# Patient Record
Sex: Female | Born: 2000 | Race: White | Hispanic: No | Marital: Single | Smoking: Never smoker
Health system: Southern US, Community
[De-identification: ages and names within clinical notes are randomized; demographics above are authoritative.]

## PROBLEM LIST (undated history)

## (undated) DIAGNOSIS — Z789 Other specified health status: Secondary | ICD-10-CM

## (undated) HISTORY — DX: Other specified health status: Z78.9

## (undated) HISTORY — PX: OTHER SURGICAL HISTORY: SHX169

---
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
## 2020-02-07 ENCOUNTER — Encounter: Payer: Self-pay | Admitting: Dermatology

## 2020-02-07 ENCOUNTER — Other Ambulatory Visit: Payer: Self-pay

## 2020-02-07 ENCOUNTER — Ambulatory Visit: Payer: 59 | Admitting: Dermatology

## 2020-02-07 DIAGNOSIS — L710 Perioral dermatitis: Secondary | ICD-10-CM | POA: Diagnosis not present

## 2020-02-07 DIAGNOSIS — L700 Acne vulgaris: Secondary | ICD-10-CM

## 2020-02-07 DIAGNOSIS — L853 Xerosis cutis: Secondary | ICD-10-CM

## 2020-02-07 MED ORDER — DOXYCYCLINE HYCLATE 20 MG PO TABS: 20.0000 mg | ORAL_TABLET | Freq: Two times a day (BID) | ORAL | 2 refills | Status: AC

## 2020-02-07 MED ORDER — TRETINOIN 0.025 % EX CREA: TOPICAL_CREAM | CUTANEOUS | 11 refills | Status: DC

## 2020-02-07 NOTE — Progress Notes (Signed)
Follow-Up Visit   Subjective  Betty Mullins is a 20 y.o. female who presents for the following: Follow-up (Patient is here today for follow up on acne. She states she has not been in some time for follow up and is no longer using any of acne medication we prescribed for her. She is also having concerns with very dry skin all over and on eyelids. ).  Patient has been off medications for 6 months.  She has had acne for years.  The following portions of the chart were reviewed this encounter and updated as appropriate:  Tobacco  Allergies  Meds  Problems  Med Hx  Surg Hx  Fam Hx      Objective  Well appearing patient in no apparent distress; mood and affect are within normal limits.  A focused examination was performed including face. Relevant physical exam findings are noted in the Assessment and Plan.  Objective  face: Scattered inflammatory papules at the face, trace to 1+ open comedones  Objective  perioral area: Many inflammatory papules in perioral distribution  Assessment & Plan  Acne vulgaris face  Chronic condition with duration over one year. Condition is bothersome to patient. Currently flared.  BP today 116/77 Plan to start spironolactone 50 mg tablets by mouth twice daily.  She tolerated this well in the past without any difficulty.  She will fax over her lab results from [place] before starting.  Spironolactone can cause increased urination and cause blood pressure to decrease. Please watch for signs of lightheadedness and be cautious when changing position. It can sometimes cause breast tenderness or an irregular period in premenopausal women. It can also increase potassium. The increase in potassium usually is not a concern unless you are taking other medicines that also increase potassium, so please be sure your doctor knows all of the other medications you are taking. This medication should not be taken by pregnant women.  This medicine should also not be  taken together with sulfa drugs like Bactrim (trimethoprim/sulfamethexazole).   Start tretinoin 0.025% cream nightly Topical retinoid medications like tretinoin/Retin-A, adapalene/Differin, tazarotene/Fabior, and Epiduo/Epiduo Forte can cause dryness and irritation when first started. Only apply a pea-sized amount to the entire affected area. Avoid applying it around the eyes, edges of mouth and creases at the nose. If you experience irritation, use a good moisturizer first and/or apply the medicine less often. If you are doing well with the medicine, you can increase how often you use it until you are applying every night. Be careful with sun protection while using this medication as it can make you sensitive to the sun. This medicine should not be used by pregnant women.    Perioral dermatitis perioral area  Start Doxycycline 20 mg take by mouth twice daily with food   Doxycycline should be taken with food to prevent nausea. Do not lay down for 30 minutes after taking. Be cautious with sun exposure and use good sun protection while on this medication. Pregnant women should not take this medication.   Advised it will likely take 2 to 3 months for complete clearance  Ordered Medications: doxycycline (PERIOSTAT) 20 MG tablet  Xerosis - Scaly xerotic patches at face - Recommend vanicream moisturizing cream. - Gentle skin care info provided.  Return in about 2 months (around 04/06/2020) for follow up on perioral dermatitis and acne .  I, Asher Muir, CMA, am acting as scribe for Darden Dates, MD.  Documentation: I have reviewed the above documentation for accuracy  and completeness, and I agree with the above.  Forest Gleason, MD

## 2020-02-07 NOTE — Patient Instructions (Addendum)
Doxycycline should be taken with food to prevent nausea. Do not lay down for 30 minutes after taking. Be cautious with sun exposure and use good sun protection while on this medication. Pregnant women should not take this medication.  Spironolactone can cause increased urination and cause blood pressure to decrease. Please watch for signs of lightheadedness and be cautious when changing position. It can sometimes cause breast tenderness or an irregular period in premenopausal women. It can also increase potassium. The increase in potassium usually is not a concern unless you are taking other medicines that also increase potassium, so please be sure your doctor knows all of the other medications you are taking. This medication should not be taken by pregnant women.  This medicine should also not be taken together with sulfa drugs like Bactrim (trimethoprim/sulfamethexazole).   Topical retinoid medications like tretinoin/Retin-A, adapalene/Differin, tazarotene/Fabior, and Epiduo/Epiduo Forte can cause dryness and irritation when first started. Only apply a pea-sized amount to the entire affected area. Avoid applying it around the eyes, edges of mouth and creases at the nose. If you experience irritation, use a good moisturizer first and/or apply the medicine less often. If you are doing well with the medicine, you can increase how often you use it until you are applying every night. Be careful with sun protection while using this medication as it can make you sensitive to the sun. This medicine should not be used by pregnant women.    Dry Skin Care  What causes dry skin?  Dry skin is common and results from inadequate moisture in the outer skin layers. Dry skin usually results from the excessive loss of moisture from the skin surface. This occurs due to two major factors: 1. Normally the skin's oil glands deposit a layer of oil on the skin's surface. This layer of oil prevents the loss of moisture from the  skin. Exposure to soaps, cleaners, solvents, and disinfectants removes this oily film, allowing water to escape. 2. Water loss from the skin increases when the humidity is low. During winter months we spend a lot of time indoors where the air is heated. Heated air has very low humidity. This also contributes to dry skin.  A tendency for dry skin may accompany such disorders as eczema. Also, as people age, the number of functioning oil glands decreases, and the tendency toward dry skin can be a sensation of skin tightness when emerging from the shower.  How do I manage dry skin?  1. Humidify your environment. This can be accomplished by using a humidifier in your bedroom at night during winter months. 2. Bathing can actually put moisture back into your skin if done right. Take the following steps while bathing to sooth dry skin:  Avoid hot water, which only dries the skin and makes itching worse. Use warm water.  Avoid washcloths or extensive rubbing or scrubbing.  Use mild soaps like unscented Dove, Oil of Olay, Cetaphil, Basis, or CeraVe.  If you take baths rather than showers, rinse off soap residue with clean water before getting out of tub.  Once out of the shower/tub, pat dry gently with a soft towel. Leave your skin damp.  While still damp, apply any medicated ointment/cream you were prescribed to the affected areas. After you apply your medicated ointment/cream, then apply your moisturizer to your whole body.This is the most important step in dry skin care. If this is omitted, your skin will continue to be dry.  The choice of moisturizer is also very  important. In general, lotion will not provider enough moisture to severely dry skin because it is water based. You should use an ointment or cream. Moisturizers should also be unscented. Good choices include Vaseline (plain petrolatum), Aquaphor, Cetaphil, CeraVe, Vanicream, DML Forte, Aveeno moisture, or Eucerin Cream. Bath oils can be  helpful, but do not replace the application of moisturizer after the bath. In addition, they make the tub slippery causing an increased risk for falls. Therefore, we do not recommend their use.

## 2020-04-18 ENCOUNTER — Ambulatory Visit: Payer: 59 | Admitting: Dermatology

## 2020-04-18 ENCOUNTER — Other Ambulatory Visit: Payer: Self-pay

## 2020-04-18 ENCOUNTER — Telehealth: Payer: Self-pay

## 2020-04-18 MED ORDER — TRETINOIN 0.025 % EX CREA: TOPICAL_CREAM | CUTANEOUS | 11 refills | Status: DC

## 2020-04-18 MED ORDER — SPIRONOLACTONE 50 MG PO TABS: 50.0000 mg | ORAL_TABLET | Freq: Two times a day (BID) | ORAL | 3 refills | Status: DC

## 2020-04-18 MED ORDER — DOXYCYCLINE HYCLATE 20 MG PO TABS: 20.0000 mg | ORAL_TABLET | Freq: Two times a day (BID) | ORAL | 3 refills | Status: AC

## 2020-04-18 NOTE — Telephone Encounter (Signed)
Spoke with patient. Since her potassium was not checked at [facility], I am fine with her restarting spironolactone. Advised that since she had said she had had labs done right before her visit I wanted to be sure if she had had potassium checked that we saw it before starting the medication. However, I do not routinely check potassium in young healthy women without other risk factors for hyperkalemia before starting spironolactone and so am fine with her starting this.   Will start spironolactone 50 mg twice a day. She has tolerated this well in the past.   Reviewed risks including decreased blood pressure. Advised to watch for signs of lightheadedness and be cautious when changing position. Also advised it can sometimes cause breast tenderness or an irregular period in premenopausal women. It can also increase potassium. Reviewed the increase in potassium usually is not a concern unless you are taking other medicines that also increase potassium, so please be sure your doctor knows all of the other medications you are taking.   Will continue doxycycline 20 mg twice a day and tretinoin 0.25% cream nightly.   She has an IUD and is not planning to become pregnant.  All medications sent in this evening.  Recommended sun protection and heliocare sun protection supplement if she is outdoors for long.   Offered to provide recommendation for dermatologist in [place] but patient says she would prefer to continue seeing me. Will plan video visit follow-up so patient does not have to drive.   Marchelle Folks, could you please call Aura to schedule her for a video visit in 2-3 months? A 12:30 PM or 4:30 PM would be fine. Thank you!

## 2020-04-18 NOTE — Telephone Encounter (Signed)
Patient came in today for 2 month follow up but unable to get insurance to go through. Patient unable to get a hold of father to confirm insurance status.   Patient was very upset regarding Spironolactone not being sent in from last visit. I tried explaining to patient we were waiting on labs to be completed. Patient states she is not getting her labs done. She has been seeing Dr. Neale Burly for several years and this has never been a problem. She also states other providers are telling her that she does not need lab work done. I tried explaining to patient labs are needed due to potential side effects and we have to rule those out. We are liable for her health. Patient begin to cry and become upset. She just feels like she needs to switch to another office. I advised patient we cancelled today's appointment due to insurance issues but once labs are done we would be more then happy to send in prescription for 2 months and schedule another follow up. Patient refuses to get labs done and doesn't feel like she needs to be seen every two months for acne. Patient states that Dr. Neale Burly tries to make her feel "stupid" because she is only 47 but she lives alone and is a educated adult. Patient also states she is not sure if its by how she dresses or the money by the way she is treated. Explained to patient she was able to do what she felt was best. Patient states she is going to seek care in [place].   Patient was seen in May of 2020 then in January of this year.  Can you suggest a physician for her to see there? She asked for a referral.  Also patient did ask for refill of her Doxycycline and Tretinoin.

## 2020-04-18 NOTE — Addendum Note (Signed)
Addended by: Sandi Mealy on: 04/18/2020 06:47 PM   Modules accepted: Orders

## 2020-04-18 NOTE — Telephone Encounter (Signed)
Patient was unable to provide insurance card and the copy of card was a picture on her phone was a cropped picture.

## 2020-04-22 NOTE — Telephone Encounter (Signed)
Patient scheduled for follow up.

## 2020-04-24 ENCOUNTER — Telehealth: Payer: Self-pay | Admitting: Certified Nurse Midwife

## 2020-04-24 NOTE — Telephone Encounter (Signed)
Called patient she states that she is having very light periods that only last 1-3 days. She is getting periods every 4 months. She can feel her strings and she had her strings checked through [employer] [facility] about 6 months ago and everything was fine. She is paranoid so she took 2 home pregnancy test and they were both negative. She just wanted to make sure that the cycles were normal.

## 2020-04-24 NOTE — Telephone Encounter (Signed)
New Message:  Pt states she worried...she can feel the string to her IUD.  She afraid that something is wrong.  She asked that we called her after 1:00pm

## 2020-04-24 NOTE — Telephone Encounter (Signed)
This is completely normal but if she would feel more comfortable I am happy to have her come in and I can check the strings.   Thanks  Pattricia Boss

## 2020-04-26 NOTE — Telephone Encounter (Signed)
Called patient to inform her. No answer no vm.

## 2020-06-28 ENCOUNTER — Telehealth: Payer: Self-pay | Admitting: Certified Nurse Midwife

## 2020-06-28 NOTE — Telephone Encounter (Signed)
Patient called stating that she had some concerns about IUD- she had it placed in 2020, stated that she is having spotting every 4-6 months and wants to know if normal- is not having any cramping or pain and can still feel string. Please Advise.

## 2020-06-28 NOTE — Telephone Encounter (Signed)
Sent patient a message via mychart to let her know that it is normal to have spotting with IUD.

## 2020-07-14 ENCOUNTER — Other Ambulatory Visit: Payer: Self-pay | Admitting: Dermatology

## 2020-07-25 ENCOUNTER — Other Ambulatory Visit: Payer: Self-pay

## 2020-07-25 ENCOUNTER — Telehealth (INDEPENDENT_AMBULATORY_CARE_PROVIDER_SITE_OTHER): Payer: BC Managed Care – PPO | Admitting: Dermatology

## 2020-07-25 DIAGNOSIS — L700 Acne vulgaris: Secondary | ICD-10-CM | POA: Diagnosis not present

## 2020-07-25 DIAGNOSIS — L710 Perioral dermatitis: Secondary | ICD-10-CM

## 2020-07-25 MED ORDER — TRETINOIN 0.05 % EX CREA: TOPICAL_CREAM | Freq: Every day | CUTANEOUS | 5 refills | Status: AC

## 2020-07-25 MED ORDER — SPIRONOLACTONE 100 MG PO TABS: 100.0000 mg | ORAL_TABLET | Freq: Every evening | ORAL | 5 refills | Status: AC

## 2020-07-25 MED ORDER — DOXYCYCLINE HYCLATE 20 MG PO TABS: 20.0000 mg | ORAL_TABLET | Freq: Two times a day (BID) | ORAL | 5 refills | Status: AC

## 2020-07-25 NOTE — Progress Notes (Signed)
Virtual Visit via Video Note  I connected with Betty Mullins on 07/30/20 at  4:30 PM EDT by a video enabled telemedicine application and verified that I am speaking with the correct person using two identifiers.  Location: Patient: Betty Mullins Provider: [facility] in [place], [place]   I discussed the limitations of evaluation and management by telemedicine and the availability of in person appointments. The patient expressed understanding and agreed to proceed.    Follow-Up Visit   Subjective  Betty Mullins is a 20 y.o. female who presents for the following: acne vulgaris (Patient for 2 - 3 month follow up on acne. She is currently using spironolactone 50 mg tablet by mouth twice daily. She denies any side effects. She is also using tretinoin 0.025 % cream topically at night. She is following with a heavy moisturizer and doesn't report any bothersome side effects. Patient is following up on perioral dermatitis also. She is currently using doxycycline 20 mg tablet by mouth twice daily. She still reports a few minor breakouts. She states mostly around her menstrual cycle. ).   The following portions of the chart were reviewed this encounter and updated as appropriate:  Tobacco  Allergies  Meds  Problems  Med Hx  Surg Hx  Fam Hx        Objective  Well appearing patient in no apparent distress; mood and affect are within normal limits.  A focused examination was performed including face and upper chest.  Relevant physical exam findings are noted in the Assessment and Plan.  Head - Anterior (Face) Trace to 1 + open comedones with scattered inflammatory papules at face. Chest and back are clear.   Head - Anterior (Face) Few inflammatory papules today along chine  Assessment & Plan  Acne vulgaris Head - Anterior (Face)  Chronic condition with duration or expected duration over one year. Condition is bothersome to patient. Currently not at goal. She flared with menses  this last month.  Continue Spironolactone 100 mg tablet by mouth nightly. If she continues to flare with menses, may consider increasing dose.  Continue doxycycline 20 mg twice a day with food.   Increase tretinoin 0.025 % cream to tretinoin 0.05 % cream nightly.  Start every other night until able to tolerate nightly.  Spironolactone can cause increased urination and cause blood pressure to decrease. Please watch for signs of lightheadedness and be cautious when changing position. It can sometimes cause breast tenderness or an irregular period in premenopausal women. It can also increase potassium. The increase in potassium usually is not a concern unless you are taking other medicines that also increase potassium, so please be sure your doctor knows all of the other medications you are taking. This medication should not be taken by pregnant women.  This medicine should also not be taken together with sulfa drugs like Bactrim (trimethoprim/sulfamethexazole).   Topical retinoid medications like tretinoin/Retin-A, adapalene/Differin, tazarotene/Fabior, and Epiduo/Epiduo Forte can cause dryness and irritation when first started. Only apply a pea-sized amount to the entire affected area. Avoid applying it around the eyes, edges of mouth and creases at the nose. If you experience irritation, use a good moisturizer first and/or apply the medicine less often. If you are doing well with the medicine, you can increase how often you use it until you are applying every night. Be careful with sun protection while using this medication as it can make you sensitive to the sun. This medicine should not be used by pregnant women.  Doxycycline should be taken with food to prevent nausea. Do not lay down for 30 minutes after taking. Be cautious with sun exposure and use good sun protection while on this medication. Pregnant women should not take this medication.    tretinoin (RETIN-A) 0.05 % cream - Head - Anterior  (Face) Apply topically at bedtime. As tolerated  spironolactone (ALDACTONE) 100 MG tablet - Head - Anterior (Face) Take 1 tablet (100 mg total) by mouth at bedtime.  Perioral dermatitis Head - Anterior (Face)  Patient clear at periorificial areas today  Will continue doxycycline for acne but not for perioral dermatitis at this time  Related Medications doxycycline (PERIOSTAT) 20 MG tablet Take 1 tablet (20 mg total) by mouth 2 (two) times daily. Take with food  I spent 23 minutes in the care of this patient for video visit today.   Return for 3 - 4 video visit . I, Asher Muir, CMA, am acting as scribe for Darden Dates, MD.  Documentation: I have reviewed the above documentation for accuracy and completeness, and I agree with the above.  Darden Dates, MD

## 2020-07-25 NOTE — Patient Instructions (Addendum)
Recommend daily broad spectrum sunscreen SPF 30+ to sun-exposed areas, reapply every 2 hours as needed. Call for new or changing lesions.  Staying in the shade or wearing long sleeves, sun glasses (UVA+UVB protection) and wide brim hats (4-inch brim around the entire circumference of the hat) are also recommended for sun protection.    Recommend taking Heliocare sun protection supplement daily in sunny weather for additional sun protection. For maximum protection on the sunniest days, you can take up to 2 capsules of regular Heliocare OR take 1 capsule of Heliocare Ultra. For prolonged exposure (such as a full day in the sun), you can repeat your dose of the supplement 4 hours after your first dose. Heliocare can be purchased at [facility] or at [email or web address].   Topical retinoid medications like tretinoin/Retin-A, adapalene/Differin, tazarotene/Fabior, and Epiduo/Epiduo Forte can cause dryness and irritation when first started. Only apply a pea-sized amount to the entire affected area. Avoid applying it around the eyes, edges of mouth and creases at the nose. If you experience irritation, use a good moisturizer first and/or apply the medicine less often. If you are doing well with the medicine, you can increase how often you use it until you are applying every night. Be careful with sun protection while using this medication as it can make you sensitive to the sun. This medicine should not be used by pregnant women.   Doxycycline should be taken with food to prevent nausea. Do not lay down for 30 minutes after taking. Be cautious with sun exposure and use good sun protection while on this medication. Pregnant women should not take this medication.   Spironolactone can cause increased urination and cause blood pressure to decrease. Please watch for signs of lightheadedness and be cautious when changing position. It can sometimes cause breast tenderness or an irregular period in premenopausal  women. It can also increase potassium. The increase in potassium usually is not a concern unless you are taking other medicines that also increase potassium, so please be sure your doctor knows all of the other medications you are taking. This medication should not be taken by pregnant women.  This medicine should also not be taken together with sulfa drugs like Bactrim (trimethoprim/sulfamethexazole).   If you have any questions or concerns for your doctor, please call our main line at [phone] and press option 4 to reach your doctor's medical assistant. If no one answers, please leave a voicemail as directed and we will return your call as soon as possible. Messages left after 4 pm will be answered the following business day.   You may also send [place] a message via MyChart. We typically respond to MyChart messages within 1-2 business days.  For prescription refills, please ask your pharmacy to contact our office. Our fax number is [phone].  If you have an urgent issue when the clinic is closed that cannot wait until the next business day, you can page your doctor at the number below.    Please note that while we do our best to be available for urgent issues outside of office hours, we are not available 24/7.   If you have an urgent issue and are unable to reach [place], you may choose to seek medical care at your doctor's office, retail clinic, urgent care center, or emergency room.  If you have a medical emergency, please immediately call 911 or go to the emergency department.  Pager Numbers  - Dr. Gwen Pounds: [phone]  - Dr. Neale Burly: [phone]  -  Dr. Roseanne Reno: [phone]  In the event of inclement weather, please call our main line at [phone] for an update on the status of any delays or closures.  Dermatology Medication Tips: Please keep the boxes that topical medications come in in order to help keep track of the instructions about where and how to use these. Pharmacies  typically print the medication instructions only on the boxes and not directly on the medication tubes.   If your medication is too expensive, please contact our office at [phone] option 4 or send [place] a message through MyChart.   We are unable to tell what your co-pay for medications will be in advance as this is different depending on your insurance coverage. However, we may be able to find a substitute medication at lower cost or fill out paperwork to get insurance to cover a needed medication.   If a prior authorization is required to get your medication covered by your insurance company, please allow [place] 1-2 business days to complete this process.  Drug prices often vary depending on where the prescription is filled and some pharmacies may offer cheaper prices.  The website www.goodrx.com contains coupons for medications through different pharmacies. The prices here do not account for what the cost may be with help from insurance (it may be cheaper with your insurance), but the website can give you the price if you did not use any insurance.  - You can print the associated coupon and take it with your prescription to the pharmacy.  - You may also stop by our office during regular business hours and pick up a GoodRx coupon card.  - If you need your prescription sent electronically to a different pharmacy, notify our office through [facility] or by phone at [phone] option 4.

## 2020-07-30 ENCOUNTER — Encounter: Payer: Self-pay | Admitting: Dermatology

## 2021-05-15 ENCOUNTER — Other Ambulatory Visit: Payer: Self-pay | Admitting: Dermatology

## 2021-05-15 DIAGNOSIS — L700 Acne vulgaris: Secondary | ICD-10-CM

## 2021-08-19 ENCOUNTER — Other Ambulatory Visit: Payer: Self-pay | Admitting: Dermatology

## 2021-08-19 DIAGNOSIS — L710 Perioral dermatitis: Secondary | ICD-10-CM

## 2021-08-19 DIAGNOSIS — L700 Acne vulgaris: Secondary | ICD-10-CM

## 2021-08-28 ENCOUNTER — Other Ambulatory Visit (HOSPITAL_COMMUNITY)
Admission: RE | Admit: 2021-08-28 | Discharge: 2021-08-28 | Disposition: A | Discharge status: Home / Self Care | Payer: BC Managed Care – PPO | Source: Ambulatory Visit | Attending: Certified Nurse Midwife | Admitting: Certified Nurse Midwife

## 2021-08-28 ENCOUNTER — Ambulatory Visit (INDEPENDENT_AMBULATORY_CARE_PROVIDER_SITE_OTHER): Payer: BC Managed Care – PPO | Admitting: Certified Nurse Midwife

## 2021-08-28 ENCOUNTER — Encounter: Payer: Self-pay | Admitting: Certified Nurse Midwife

## 2021-08-28 VITALS — BP 123/78 | HR 79 | Ht 63.0 in | Wt 168.8 lb

## 2021-08-28 DIAGNOSIS — Z01419 Encounter for gynecological examination (general) (routine) without abnormal findings: Secondary | ICD-10-CM | POA: Diagnosis present

## 2021-08-28 DIAGNOSIS — Z124 Encounter for screening for malignant neoplasm of cervix: Secondary | ICD-10-CM | POA: Diagnosis present

## 2021-08-28 DIAGNOSIS — Z23 Encounter for immunization: Secondary | ICD-10-CM

## 2021-08-28 NOTE — Patient Instructions (Signed)

## 2021-08-28 NOTE — Progress Notes (Signed)
GYNECOLOGY ANNUAL PREVENTATIVE CARE ENCOUNTER NOTE  History:     Betty Mullins is a 21 y.o. G0P0000 female here for a routine annual gynecologic exam.  Current complaints: none.   Denies abnormal vaginal bleeding, discharge, pelvic pain, problems with intercourse or other gynecologic concerns.     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 Contraception: IUD Last Pap: has not had.  Last mammogram: n/a .   Obstetric History OB History  Gravida Para Term Preterm AB Living  0 0 0 0 0 0  SAB IAB Ectopic Multiple Live Births  0 0 0 0 0    Past Medical History:  Diagnosis Date   Known health problems: none     Past Surgical History:  Procedure Laterality Date   none      Current Outpatient Medications on File Prior to Visit  Medication Sig Dispense Refill   levonorgestrel (MIRENA) 20 MCG/24HR IUD 1 each by Intrauterine route once.     spironolactone (ALDACTONE) 100 MG tablet Take 1 tablet (100 mg total) by mouth at bedtime. 30 tablet 5   chlorhexidine (PERIDEX) 0.12 % solution PLEASE SEE ATTACHED FOR DETAILED DIRECTIONS (Patient not taking: Reported on 02/07/2020)     ibuprofen (ADVIL,MOTRIN) 200 MG tablet Take by mouth. (Patient not taking: Reported on 02/07/2020)     No current facility-administered medications on file prior to visit.    Allergies  Allergen Reactions   Hydrocodone     Social History:  reports that she has never smoked. She has never used smokeless tobacco. She reports current alcohol use. She reports that she does not use drugs.  Family History  Problem Relation Age of Onset   Diabetes Father    Thyroid disease Maternal Aunt    Breast cancer Neg Hx    Ovarian cancer Neg Hx    Colon cancer Neg Hx      The following portions of the patient's history were reviewed and updated as appropriate: allergies, current medications, past family history, past medical history, past social history, past surgical history and problem list.  Review of Systems Pertinent items noted in HPI and remainder of comprehensive ROS otherwise negative.  Physical Exam:  BP 123/78   Pulse 79   Ht 5\' 3"  (1.6 m)   Wt 168 lb 12.8 oz (76.6 kg)   LMP 08/26/2021   BMI 29.90 kg/m  CONSTITUTIONAL: Well-developed, well-nourished female in no acute distress.  HENT:  Normocephalic, atraumatic, External right and left ear normal. Oropharynx is clear and moist EYES: Conjunctivae and EOM are normal. Pupils are equal, round, and reactive to light. No scleral icterus.  NECK: Normal range of motion, supple, no masses.  Normal thyroid.  SKIN: Skin is warm and dry. No rash noted. Not diaphoretic. No erythema. No pallor. MUSCULOSKELETAL: Normal range of motion. No tenderness.  No cyanosis, clubbing, or edema.  2+ distal pulses. NEUROLOGIC: Alert and oriented to person, place, and time. Normal reflexes, muscle tone coordination.  PSYCHIATRIC: Normal mood and affect. Normal behavior. Normal judgment and thought content. CARDIOVASCULAR: Normal heart rate noted, regular rhythm RESPIRATORY: Clear to auscultation bilaterally. Effort and breath sounds normal, no problems with respiration noted. BREASTS: Symmetric in size. No masses, tenderness, skin changes, nipple drainage, or lymphadenopathy bilaterally.  ABDOMEN: Soft, no  distention noted.  No tenderness, rebound or guarding.  PELVIC: Normal appearing external genitalia and urethral meatus; normal appearing vaginal mucosa and cervix.  No abnormal discharge noted.  Pap smear obtained.  Strings present. Contact bleeding present. Normal uterine size, no other palpable masses, no uterine or adnexal tenderness.  .   Assessment and Plan:    1. Women's annual routine gynecological  examination   Pap: Will follow up results of pap smear and manage accordingly. Mammogram : n/a  Labs: none Refills: none Referral: none Routine preventative health maintenance measures emphasized. Please refer to After Visit Summary for other counseling recommendations.      Doreene Burke, CNM [facility],  [facility]

## 2021-09-03 ENCOUNTER — Encounter: Payer: Self-pay | Admitting: Certified Nurse Midwife

## 2021-09-03 LAB — CYTOLOGY - PAP
Chlamydia: NEGATIVE
Comment: NEGATIVE

## 2021-10-31 ENCOUNTER — Ambulatory Visit (INDEPENDENT_AMBULATORY_CARE_PROVIDER_SITE_OTHER): Payer: BC Managed Care – PPO

## 2021-10-31 ENCOUNTER — Ambulatory Visit: Payer: BC Managed Care – PPO

## 2021-10-31 DIAGNOSIS — Z23 Encounter for immunization: Secondary | ICD-10-CM

## 2021-10-31 NOTE — Patient Instructions (Signed)
HPV (Human Papillomavirus) Vaccine: What You Need to Know 1. Why get vaccinated? HPV (human papillomavirus) vaccine can prevent infection with some types of human papillomavirus. HPV infections can cause certain types of cancers, including: cervical, vaginal, and vulvar cancers in women penile cancer in men anal cancers in both men and women cancers of tonsils, base of tongue, and back of throat (oropharyngeal cancer) in both men and women HPV infections can also cause anogenital warts. HPV vaccine can prevent over 90% of cancers caused by HPV. HPV is spread through intimate skin-to-skin or sexual contact. HPV infections are so common that nearly all people will get at least one type of HPV at some time in their lives. Most HPV infections go away on their own within 2 years. But sometimes HPV infections will last longer and can cause cancers later in life. 2. HPV vaccine HPV vaccine is routinely recommended for adolescents at 11 or 21 years of age to ensure they are protected before they are exposed to the virus. HPV vaccine may be given beginning at age 9 years and vaccination is recommended for everyone through 21 years of age. HPV vaccine may be given to adults 27 through 21 years of age, based on discussions between the patient and health care provider. Most children who get the first dose before 15 years of age need 2 doses of HPV vaccine. People who get the first dose at or after 15 years of age and younger people with certain immunocompromising conditions need 3 doses. Your health care provider can give you more information. HPV vaccine may be given at the same time as other vaccines. 3. Talk with your health care provider Tell your vaccination provider if the person getting the vaccine: Has had an allergic reaction after a previous dose of HPV vaccine, or has any severe, life-threatening allergies Is pregnant--HPV vaccine is not recommended until after pregnancy In some cases, your health  care provider may decide to postpone HPV vaccination until a future visit. People with minor illnesses, such as a cold, may be vaccinated. People who are moderately or severely ill should usually wait until they recover before getting HPV vaccine. Your health care provider can give you more information. 4. Risks of a vaccine reaction Soreness, redness, or swelling where the shot is given can happen after HPV vaccination. Fever or headache can happen after HPV vaccination. People sometimes faint after medical procedures, including vaccination. Tell your provider if you feel dizzy or have vision changes or ringing in the ears. As with any medicine, there is a very remote chance of a vaccine causing a severe allergic reaction, other serious injury, or death. 5. What if there is a serious problem? An allergic reaction could occur after the vaccinated person leaves the clinic. If you see signs of a severe allergic reaction (hives, swelling of the face and throat, difficulty breathing, a fast heartbeat, dizziness, or weakness), call 9-1-1 and get the person to the nearest hospital. For other signs that concern you, call your health care provider. Adverse reactions should be reported to the Vaccine Adverse Event Reporting System (VAERS). Your health care provider will usually file this report, or you can do it yourself. Visit the VAERS website at www.vaers.hhs.gov or call 1-800-822-7967. VAERS is only for reporting reactions, and VAERS staff members do not give medical advice. 6. The National Vaccine Injury Compensation Program The National Vaccine Injury Compensation Program (VICP) is a federal program that was created to compensate people who may have been injured   by certain vaccines. Claims regarding alleged injury or death due to vaccination have a time limit for filing, which may be as short as two years. Visit the VICP website at GoldCloset.com.ee or call 413 827 2728 to learn about the  program and about filing a claim. 7. How can I learn more? Ask your health care provider. Call your local or state health department. Visit the website of the Food and Drug Administration (FDA) for vaccine package inserts and additional information at TraderRating.uy. Contact the Centers for Disease Control and Prevention (CDC): Call 310-262-6779 (1-800-CDC-INFO) or Visit CDC's website at http://hunter.com/. Source: CDC Vaccine Information Statement HPV Vaccine (08/25/2019) This same material is available at http://www.wolf.info/ for no charge. This information is not intended to replace advice given to you by your health care provider. Make sure you discuss any questions you have with your health care provider. Document Revised: 12/04/2020 Document Reviewed: 09/26/2020 Elsevier Patient Education  [place].

## 2021-11-11 ENCOUNTER — Other Ambulatory Visit: Payer: Self-pay | Admitting: Dermatology

## 2021-11-11 DIAGNOSIS — L710 Perioral dermatitis: Secondary | ICD-10-CM

## 2021-11-11 DIAGNOSIS — L700 Acne vulgaris: Secondary | ICD-10-CM

## 2022-03-06 ENCOUNTER — Ambulatory Visit (INDEPENDENT_AMBULATORY_CARE_PROVIDER_SITE_OTHER): Payer: BC Managed Care – PPO

## 2022-03-06 VITALS — BP 100/69 | HR 83 | Ht 63.0 in | Wt 179.8 lb

## 2022-03-06 DIAGNOSIS — Z23 Encounter for immunization: Secondary | ICD-10-CM | POA: Diagnosis not present

## 2022-03-06 NOTE — Progress Notes (Signed)
    NURSE VISIT NOTE  Subjective:    Patient ID: Betty Mullins, female    DOB: [birth year], 22 y.o.   MRN: [id number]  HPI  Patient is a 22 y.o. G0P0000 female Single Caucasian female who presents for her third Gardasil injection. Order to administer given by Philip Aspen, CNM.    Objective:    BP 100/69   Pulse 83   Ht 5' 3"$  (1.6 m)   Wt 179 lb 12.8 oz (81.6 kg)   BMI 31.85 kg/m   22 y.o. LMP:  IUD  Contraception:  *Mirena Given by: Douglass Rivers, CMA Site:  left deltoid   Assessment:   1. Need for HPV vaccination      Plan:   Patient has completed the Gardasil Vaccine Schedule.    Marykay Lex, CMA

## 2023-04-02 ENCOUNTER — Other Ambulatory Visit (HOSPITAL_COMMUNITY)
Admission: RE | Admit: 2023-04-02 | Discharge: 2023-04-02 | Disposition: A | Discharge status: Home / Self Care | Source: Ambulatory Visit | Attending: Certified Nurse Midwife | Admitting: Certified Nurse Midwife

## 2023-04-02 ENCOUNTER — Encounter: Payer: Self-pay | Admitting: Certified Nurse Midwife

## 2023-04-02 ENCOUNTER — Ambulatory Visit (INDEPENDENT_AMBULATORY_CARE_PROVIDER_SITE_OTHER): Payer: Self-pay | Admitting: Certified Nurse Midwife

## 2023-04-02 VITALS — BP 116/79 | HR 89 | Ht 64.0 in | Wt 177.4 lb

## 2023-04-02 DIAGNOSIS — Z113 Encounter for screening for infections with a predominantly sexual mode of transmission: Secondary | ICD-10-CM | POA: Insufficient documentation

## 2023-04-02 DIAGNOSIS — Z124 Encounter for screening for malignant neoplasm of cervix: Secondary | ICD-10-CM | POA: Insufficient documentation

## 2023-04-02 DIAGNOSIS — Z01419 Encounter for gynecological examination (general) (routine) without abnormal findings: Secondary | ICD-10-CM | POA: Insufficient documentation

## 2023-04-02 DIAGNOSIS — B3731 Acute candidiasis of vulva and vagina: Secondary | ICD-10-CM | POA: Diagnosis not present

## 2023-04-02 DIAGNOSIS — B9689 Other specified bacterial agents as the cause of diseases classified elsewhere: Secondary | ICD-10-CM | POA: Diagnosis not present

## 2023-04-02 DIAGNOSIS — N898 Other specified noninflammatory disorders of vagina: Secondary | ICD-10-CM | POA: Diagnosis present

## 2023-04-02 DIAGNOSIS — N760 Acute vaginitis: Secondary | ICD-10-CM | POA: Diagnosis not present

## 2023-04-02 MED ORDER — DOXYCYCLINE HYCLATE 100 MG PO CAPS: 100.0000 mg | ORAL_CAPSULE | Freq: Every day | ORAL | 3 refills | Status: AC

## 2023-04-02 NOTE — Patient Instructions (Signed)

## 2023-04-02 NOTE — Progress Notes (Signed)
 GYNECOLOGY ANNUAL PREVENTATIVE CARE ENCOUNTER NOTE  History:     Betty Mullins is a 23 y.o. G0P0000 female here for a routine annual gynecologic exam.  Current complaints: recurrent yeast, state she has started to work out more vigorously and has perspiration and chaffing.   Denies abnormal vaginal bleeding, discharge, pelvic pain, problems with intercourse or other gynecologic concerns.     Social Relationship: singe  Living: alone  Work: Airline pilot in [employer] Exercise: 50 hard, running  Smoke/Alcohol/drug use: alcohol on weekends, Marijuana occasional . Denies vaping and smoking   Gynecologic History No LMP recorded. (Menstrual status: IUD). Contraception: IUD Last Pap: 08/28/2021. Results were: abnormal LSIL Last mammogram: n/a .   Obstetric History OB History  Gravida Para Term Preterm AB Living  0 0 0 0 0 0  SAB IAB Ectopic Multiple Live Births  0 0 0 0 0    Past Medical History:  Diagnosis Date   Known health problems: none     Past Surgical History:  Procedure Laterality Date   none      Current Outpatient Medications on File Prior to Visit  Medication Sig Dispense Refill   levonorgestrel (MIRENA) 20 MCG/24HR IUD 1 each by Intrauterine route once.     spironolactone (ALDACTONE) 100 MG tablet Take 1 tablet (100 mg total) by mouth at bedtime. 30 tablet 5   No current facility-administered medications on file prior to visit.    Allergies  Allergen Reactions   Hydrocodone Other (See Comments)    Social History:  reports that she has never smoked. She has never used smokeless tobacco. She reports current alcohol use. She reports that she does not use drugs.  Family History  Problem Relation Age of Onset   Diabetes Father    Thyroid disease Maternal Aunt    Breast cancer Neg Hx    Ovarian cancer Neg Hx    Colon cancer Neg Hx     The following portions of the patient's history were reviewed and updated as appropriate: allergies, current  medications, past family history, past medical history, past social history, past surgical history and problem list.  Review of Systems Pertinent items noted in HPI and remainder of comprehensive ROS otherwise negative.  Physical Exam:  BP 116/79   Pulse 89   Ht 5\' 4"  (1.626 m)   Wt 177 lb 6.4 oz (80.5 kg)   BMI 30.45 kg/m  CONSTITUTIONAL: Well-developed, well-nourished female in no acute distress.  HENT:  Normocephalic, atraumatic, External right and left ear normal. Oropharynx is clear and moist EYES: Conjunctivae and EOM are normal. Pupils are equal, round, and reactive to light. No scleral icterus.  NECK: Normal range of motion, supple, no masses.  Normal thyroid.  SKIN: Skin is warm and dry. No rash noted. Not diaphoretic. No erythema. No pallor. MUSCULOSKELETAL: Normal range of motion. No tenderness.  No cyanosis, clubbing, or edema.  2+ distal pulses. NEUROLOGIC: Alert and oriented to person, place, and time. Normal reflexes, muscle tone coordination.  PSYCHIATRIC: Normal mood and affect. Normal behavior. Normal judgment and thought content. CARDIOVASCULAR: Normal heart rate noted, regular rhythm RESPIRATORY: Clear to auscultation bilaterally. Effort and breath sounds normal, no problems with respiration noted. BREASTS: Symmetric in size. No masses, tenderness, skin changes, nipple drainage, or lymphadenopathy bilaterally.  ABDOMEN: Soft, no distention noted.  No tenderness, rebound or guarding.  PELVIC: Mild redness- appearing external genitalia and urethral meatus; normal appearing vaginal mucosa and cervix.  White discharge noted.  Pap smear obtained. Contact bleeding, strings present.  Normal uterine size, no other palpable masses, no uterine or adnexal tenderness.  .   Assessment and Plan:    1. Women's annual routine gynecological examination (Primary)  Pap: Will follow up results of pap smear and manage accordingly. Mammogram : n/a  Labs: vaginal swab Refills:  doxycycline for acne Referral: none  Routine preventative health maintenance measures emphasized. Please refer to After Visit Summary for other counseling recommendations.      Doreene Burke, CNM [facility]  [facility],  [facility]

## 2023-04-06 ENCOUNTER — Encounter: Payer: Self-pay | Admitting: Certified Nurse Midwife

## 2023-04-06 ENCOUNTER — Other Ambulatory Visit: Payer: Self-pay | Admitting: Certified Nurse Midwife

## 2023-04-06 LAB — CERVICOVAGINAL ANCILLARY ONLY
Bacterial Vaginitis (gardnerella): POSITIVE — AB
Candida Glabrata: NEGATIVE
Candida Vaginitis: POSITIVE — AB
Chlamydia: NEGATIVE
Comment: NEGATIVE
Comment: NEGATIVE
Comment: NEGATIVE
Comment: NEGATIVE
Comment: NEGATIVE
Comment: NORMAL
Neisseria Gonorrhea: NEGATIVE
Trichomonas: NEGATIVE

## 2023-04-06 MED ORDER — FLUCONAZOLE 150 MG PO TABS: 150.0000 mg | ORAL_TABLET | Freq: Once | ORAL | 0 refills | Status: AC

## 2023-04-06 MED ORDER — METRONIDAZOLE 500 MG PO TABS: 500.0000 mg | ORAL_TABLET | Freq: Two times a day (BID) | ORAL | 0 refills | Status: AC

## 2023-04-12 ENCOUNTER — Other Ambulatory Visit: Payer: Self-pay | Admitting: Certified Nurse Midwife

## 2023-04-12 LAB — CYTOLOGY - PAP: Diagnosis: NEGATIVE

## 2023-12-06 ENCOUNTER — Telehealth: Payer: Self-pay

## 2023-12-06 NOTE — Telephone Encounter (Signed)
 Betty Mullins called triage asking if [name] would refill her Tretinoin  she stated it was a very long time since she took it, I can see it was on 2022. Per patient [name] told her she would prescribe her acne medicines for her.

## 2023-12-07 ENCOUNTER — Other Ambulatory Visit: Payer: Self-pay | Admitting: Certified Nurse Midwife

## 2023-12-07 MED ORDER — TRETINOIN 0.05 % EX CREA: TOPICAL_CREAM | Freq: Every day | CUTANEOUS | 0 refills | Status: AC
